# Patient Record
Sex: Male | Born: 1945 | Race: White | Hispanic: No | Marital: Single | Smoking: Former smoker
Health system: Southern US, Community
[De-identification: ages and names within clinical notes are randomized; demographics above are authoritative.]

## PROBLEM LIST (undated history)

## (undated) DIAGNOSIS — M1990 Unspecified osteoarthritis, unspecified site: Secondary | ICD-10-CM

## (undated) DIAGNOSIS — J189 Pneumonia, unspecified organism: Secondary | ICD-10-CM

## (undated) DIAGNOSIS — E119 Type 2 diabetes mellitus without complications: Secondary | ICD-10-CM

## (undated) DIAGNOSIS — M545 Pain in right leg: Secondary | ICD-10-CM

## (undated) DIAGNOSIS — K219 Gastro-esophageal reflux disease without esophagitis: Secondary | ICD-10-CM

## (undated) DIAGNOSIS — Z8719 Personal history of other diseases of the digestive system: Secondary | ICD-10-CM

## (undated) DIAGNOSIS — K862 Cyst of pancreas: Secondary | ICD-10-CM

## (undated) DIAGNOSIS — M79604 Pain in right leg: Secondary | ICD-10-CM

## (undated) HISTORY — PX: INCISIONAL HERNIA REPAIR: SHX193

---

## 2006-06-30 DIAGNOSIS — K862 Cyst of pancreas: Secondary | ICD-10-CM

## 2006-06-30 HISTORY — PX: PANCREAS SURGERY: SHX731

## 2006-06-30 HISTORY — DX: Cyst of pancreas: K86.2

## 2017-04-02 ENCOUNTER — Ambulatory Visit: Payer: Self-pay | Admitting: Orthopedic Surgery

## 2017-04-02 NOTE — Progress Notes (Signed)
Please place orders in EPIC as patient is being scheduled for a pre-op appointment! Thank you! 

## 2017-04-02 NOTE — H&P (Signed)
Seth Decker is an 71 y.o. male.   Chief Complaint: Chief complaint is bilateral lower extremity radicular pain. HPI: The patient is a 71 year old male who presents to the practice today for a transition into care. The patient is transitioning into care from other and a summary of care was reviewed. The patient is here today in referral from Dr.Ramos. The patient reports low back symptoms including pain which began 1 year(s) (DOI: 12/30/15) ago following a specific injury. The injury occurred 1 year(s) ago at work due to twisting (Twisted while lifting load of water). Symptoms are reported to be located in the right low back and Symptoms include pain and numbness. The pain radiates to the right groin, right buttock, right thigh and right lower leg (into the foot). The patient describes the pain as sharp, dull, burning and aching. The patient describes the severity of their symptoms as varies in severity, about a 9-10 today. The patient feels as if the symptoms are worsening. Symptoms are exacerbated by standing (and walking). Symptoms are relieved by non-opioid analgesics (Tramadol and Flexeril). Current treatment includes non-opioid analgesics (Tramadol) and muscle relaxants (Flexeril). Prior to being seen today the patient was previously evaluated by a colleague (Dr.Ramos) week(s) ago. Symptoms present at the patient's previous evaluation included back pain. Past evaluation has included MRI of the lumbar spine. Past treatment has included nonsteroidal anti-inflammatory drugs (Voltaren Gel), non-opioid analgesics (Tramadol), muscle relaxants (Flexeril), epidural injections (10/21/16 - ESI L5-S1 and 12/23/16 - Right L5 SNRB (helps for a day or so)), physical therapy (no help) and Gabapentin. 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 Note for "Back pain": Currently under sedentary work only  HISTORY OF PRESENT ILLNESS: This is a very pleasant gentleman, who presents through kind referral by Dr.  Sheran Luz for refractory lower extremity radicular pain.  On reviewing his extensive medical records to date, he was injured in 12/30/2015. He was lifting up on a hose on a port-a-john and twisted his back and ever since then, he has had radiating pain back into his right leg. He was seen at [facility]. MRI was obtained. Surgery was not recommended. It was recommended he see a physiatrist for further evaluation. He did physical therapy and activity modification until he was seen by Dr. Ethelene Hal where his pain was 10/10. Dr. Ethelene Hal placed him on gabapentin which made him dizzy. He has been placed on Ultram which he indicates does not address the pain sufficiently.  They discussed an epidural steroid injection, which he had at L5-S1. He had no relief at all from that. He did secure an attorney at [employer]. Dr. Ethelene Hal suggested L5 selective nerve root block due to neuroforaminal stenosis. The gentleman, at that point in time, did not have a sustained relief from it; however, for one hour he had numbness and relief of his pain indicating a positive diagnostic block. He had a slight reaction to Chlorascrub antiseptic. He returns for a surgical evaluation.  He reports his pain is predominantly buttock, right lower extremity radicular pain and secondarily leg pain.  He presents here with his sister.  The patient's pain drawing is organic.  No past medical history on file.  No past surgical history on file.  No family history on file. Social History:  has no tobacco, alcohol, and drug history on file.  Allergies: Allergies not on file   (Not in a hospital admission)  No results found for this or any previous visit (from the past  48 hour(s)). No results found.  Review of Systems  Constitutional: Negative.   HENT: Negative.   Eyes: Negative.   Respiratory: Negative.   Cardiovascular: Negative.   Gastrointestinal: Negative.   Genitourinary: Negative.   Musculoskeletal: Positive for back  pain.  Skin: Negative.   Neurological: Positive for sensory change and focal weakness.  Psychiatric/Behavioral: Negative.     There were no vitals taken for this visit. Physical Exam  Constitutional: He is oriented to person, place, and time. He appears well-developed.  HENT:  Head: Normocephalic.  Eyes: Pupils are equal, round, and reactive to light.  Neck: Normal range of motion.  Cardiovascular: Normal rate.   Respiratory: Effort normal.  GI: Soft.  Musculoskeletal:  On exam, moderate distress. Straight leg raises buttock, thigh and calf pain on the right, negative on the left. He has pain with extension. It is somewhat relieved with forward flexion. Limited flexion and extension. He has bilateral EHL weakness, right greater than the left.  Lumbar spine exam reveals no evidence of soft tissue swelling, deformity or skin ecchymosis. On palpation there is no tenderness of the lumbar spine. No flank pain with percussion. The abdomen is soft and nontender. Nontender over the trochanters. No cellulitis or lymphadenopathy.  Good range of motion of the lumbar spine without associated pain. Motor is 5/5 including tibialis anterior, plantar flexion, quadriceps and hamstrings. Patient is normoreflexic. There is no Babinski or clonus. Sensory exam is intact to light touch. Patient has good distal pulses. No DVT. Normal range of motion without instability of the hips, knees and ankles.  Neurological: He is alert and oriented to person, place, and time.    Three view radiographs, AP and lateral, flexion and extension demonstrates thoracolumbar scoliosis, dextrorotatory. He has a narrow interlaminar window at 4-5 and at 5-1. On the lateral, he has disc degeneration at 5-1, 4-5 and 3-4, mild at 2-1. Mild calcification of the aorta is noted.  MRI obtained 02/05/2016, he reports no changes in his symptoms since that MRI. That indicates end-stage disc degeneration at 5-1. He has multilevel disc  degeneration and disc protrusions at 3-4, 4-5 and at 5-1. There is a small, paracentral disc herniation at 4-5 to the right. There is neuroforaminal stenosis of L5 to the right as well.  There is associated lateral recess stenosis at L5-S1.  Assessment/Plan 1. 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 2. 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 3. Tobacco dependence.  Recommendations and discussion: Extensive discussion with Mr. Egger concerning his current pathology, relevant anatomy and treatment options. Certainly, I feel that there are multiple options. I feel that he has had appropriate treatment to date with diagnostic studies, conservative treatment including therapy, injection therapy and pharmacologic management. Two options at this point; one to currently live with the symptoms and continue with pain management, injection therapy, etc. and the current restrictions that he has been forwarded.  From a surgical standpoint, on reviewing his studies to date he has an L5-S1 radiculopathy with EHL weakness and a diagnostic block at 5, given his pathology. He may have moderate-to-severe foraminal stenosis of L5 on the right and the lateral recess at L5-S1. He does have facet arthropathy and this indicating possibly abutting the L5 nerve root at 4-5 and at L5-S1. Given the profound radiculopathy, an option will be to consider a microlumbar decompression and foraminotomy and partial facetectomy at L5-S1  and a hemilaminectomy on the right in an effort to decompress the L5 and the S1 nerve roots. I did indicate that, that procedure would not address the back pain but with the complaint of radicular pain and the procedure that would address back pain, would require a multilevel fusion. I do not feel that a magnitude of that intervention would not  be appropriate in this instance.  Again though, he did have a diagnostic block at L5 that confirmed the pain generator; however, given that it has been over a year's duration, certainly a decompression even at this point may be unproductive. However, he is at the end of conservative treatment and feels he cannot live with his current symptomatology. It is reasonable to perform a decompression in the hopes to alleviate at least in some part, his radiculopathy. He, however, would have to commit to tobacco cessation in my opinion prior to that. I indicated deleterious side effects upon healing, epidural fibrosis, etc.  Again particularly down the L5-S1 if you can decompress the lateral recess and perform a foraminotomy of L5 and remove the superior articulating process of S1 that tends to project and impinge upon the 5 root when individuals are in the upright extended position, it would be an option. Again, I agreed with his initial conservative treatment as this would be considered a last resort. He has no symptoms on the left. We discussed tobacco cessation. In extensive detail, he has tried many modalities. He does get anxious when he is attempting to tobacco cessation. We; therefore, discussed Ativan preoperatively and perioperatively as an anxiolytic agent.  Recommend overnight immediate ambulation. We did discuss the risks and benefits.  I had an extensive discussion of the risks and benefits of the lumbar decompression with the patient including bleeding, infection, damage to neurovascular structures, epidural fibrosis, CSF leak requiring repair. We also discussed increase in pain, adjacent segment disease, recurrent disc herniation, need for future surgery including repeat decompression and/or fusion. We also discussed risks of postoperative hematoma, paralysis, anesthetic complications including DVT, PE, death, cardiopulmonary dysfunction. In addition, the perioperative and postoperative courses were  discussed in detail including the rehabilitative time and return to functional activity and work. I provided the patient with an illustrated handout and utilized the appropriate surgical models.  Particularly, I would not address back pain. They may have ongoing symptoms secondary to battered root syndrome etc. It does seem; however, significantly disabled by the predominant radiculopathy and therefore, given its duration and failing all other treatment and the absence of tolerance to gabapentin, other pharmacologic management I feel that it is appropriate to proceed with a limited decompression and foraminotomy on the right.  We spent a considerable time discussing all of these issues and over 40 minutes were dedicated to this discussion and his pathology, relevant anatomy and treatment options and treatment today. He is a diabetic. We discussed strict control of his glucose preoperatively and the time to maximum medical improvement will be three months postop.  He is fairly thin. He reports to be otherwise healthy. No chest pain, shortness of breath, strokes or heart attacks.  I appreciate the opportunity to evaluate Mr. Kaigler. Discussed this at length and separately with his case manager. Again, I feel that if his pain was controlled pharmacologically, I would recommend nonoperative treatment. Again though, I do feel it is reasonable to proceed with a decompression.  microlumbar decompression L5-S1 right, possible L4-5  Dorothy Spark., PA-C for Dr. Shelle Iron 04/02/2017, 3:21 PM

## 2017-04-08 ENCOUNTER — Other Ambulatory Visit (HOSPITAL_COMMUNITY): Payer: Self-pay | Admitting: Emergency Medicine

## 2017-04-08 NOTE — Patient Instructions (Addendum)
Seth Decker  04/08/2017   Your procedure is scheduled on: 04-15-17  Report to [facility] Main  Entrance Take [place]  elevators to 3rd floor to  Short Stay Center at 930AM.    Call this number if you have problems the morning of surgery [phone]    Remember: ONLY 1 PERSON MAY GO WITH YOU TO SHORT STAY TO GET  READY MORNING OF YOUR SURGERY.  Do not eat food or drink liquids :After Midnight.     Take these medicines the morning of surgery with A SIP OF WATER: tylenol as needed, eye drops, tramadol as needed                                You may not have any metal on your body including hair pins and              piercings  Do not wear jewelry, make-up, lotions, powders or perfumes, deodorant                          Men may shave face and neck.   Do not bring valuables to the hospital. [facility] IS NOT             RESPONSIBLE   FOR VALUABLES.  Contacts, dentures or bridgework may not be worn into surgery.  Leave suitcase in the car. After surgery it may be brought to your room.                Please read over the following fact sheets you were given: _____________________________________________________________________  How to Manage Your Diabetes Before and After Surgery  Why is it important to control my blood sugar before and after surgery? . Improving blood sugar levels before and after surgery helps healing and can limit problems. . A way of improving blood sugar control is eating a healthy diet by: o  Eating less sugar and carbohydrates o  Increasing activity/exercise o  Talking with your doctor about reaching your blood sugar goals . High blood sugars (greater than 180 mg/dL) can raise your risk of infections and slow your recovery, so you will need to focus on controlling your diabetes during the weeks before surgery. . Make sure that the doctor who takes care of your diabetes knows about your planned surgery including the date and  location.  How do I manage my blood sugar before surgery?   . If you are admitted to the hospital after surgery: o Your blood sugar will be checked by the staff and you will probably be given insulin after surgery (instead of oral diabetes medicines) to make sure you have good blood sugar levels. o The goal for blood sugar control after surgery is 80-180 mg/dL.   WHAT DO I DO ABOUT MY DIABETES MEDICATION?    . THE DAY BEFORE SURGERY, take  METFORMIN as normal       . THE MORNING OF SURGERY, DO NOT TAKE ANY DIABETIC MEDICATIONS !!  Patient Signature:  Date:   Nurse Signature:  Date:   Reviewed and Endorsed by [facility] Patient Education Committee, August 2015   [facility] - Preparing for Surgery Before surgery, you can play an important role.  Because skin is not sterile, your skin needs to be as free of germs as possible.  You can reduce the number of germs on your skin by washing with CHG (chlorahexidine gluconate) soap before surgery.  CHG is an antiseptic cleaner which kills germs and bonds with the skin to continue killing germs even after washing. Please DO NOT use if you have an allergy to CHG or antibacterial soaps.  If your skin becomes reddened/irritated stop using the CHG and inform your nurse when you arrive at Short Stay. Do not shave (including legs and underarms) for at least 48 hours prior to the first CHG shower.  You may shave your face/neck. Please follow these instructions carefully:  1.  Shower with CHG Soap the night before surgery and the  morning of Surgery.  2.  If you choose to wash your hair, wash your hair first as usual with your  normal  shampoo.  3.  After you shampoo, rinse your hair and body thoroughly to remove the  shampoo.                           4.  Use CHG as you would any other liquid soap.  You can apply chg directly  to the skin and wash                       Gently with a scrungie or clean washcloth.  5.  Apply the CHG Soap to your body  ONLY FROM THE NECK DOWN.   Do not use on face/ open                           Wound or open sores. Avoid contact with eyes, ears mouth and genitals (private parts).                       Wash face,  Genitals (private parts) with your normal soap.             6.  Wash thoroughly, paying special attention to the area where your surgery  will be performed.  7.  Thoroughly rinse your body with warm water from the neck down.  8.  DO NOT shower/wash with your normal soap after using and rinsing off  the CHG Soap.                9.  Pat yourself dry with a clean towel.            10.  Wear clean pajamas.            11.  Place clean sheets on your bed the night of your first shower and do not  sleep with pets. Day of Surgery : Do not apply any lotions/deodorants the morning of surgery.  Please wear clean clothes to the hospital/surgery center.  FAILURE TO FOLLOW THESE INSTRUCTIONS MAY RESULT IN THE CANCELLATION OF YOUR SURGERY PATIENT SIGNATURE_________________________________  NURSE SIGNATURE__________________________________  ________________________________________________________________________   Seth Decker  An incentive spirometer is a tool that can help keep your lungs clear and active. This tool measures how well you are filling your lungs with each breath. Taking long deep breaths may help reverse or decrease the chance of developing breathing (pulmonary) problems (especially infection) following:  A long period of time when you are unable to move or be active. BEFORE THE PROCEDURE   If the spirometer includes an indicator to show your best effort, your nurse or respiratory therapist will set  it to a desired goal.  If possible, sit up straight or lean slightly forward. Try not to slouch.  Hold the incentive spirometer in an upright position. INSTRUCTIONS FOR USE  1. Sit on the edge of your bed if possible, or sit up as far as you can in bed or on a chair. 2. Hold the  incentive spirometer in an upright position. 3. Breathe out normally. 4. Place the mouthpiece in your mouth and seal your lips tightly around it. 5. Breathe in slowly and as deeply as possible, raising the piston or the ball toward the top of the column. 6. Hold your breath for 3-5 seconds or for as long as possible. Allow the piston or ball to fall to the bottom of the column. 7. Remove the mouthpiece from your mouth and breathe out normally. 8. Rest for a few seconds and repeat Steps 1 through 7 at least 10 times every 1-2 hours when you are awake. Take your time and take a few normal breaths between deep breaths. 9. The spirometer may include an indicator to show your best effort. Use the indicator as a goal to work toward during each repetition. 10. After each set of 10 deep breaths, practice coughing to be sure your lungs are clear. If you have an incision (the cut made at the time of surgery), support your incision when coughing by placing a pillow or rolled up towels firmly against it. Once you are able to get out of bed, walk around indoors and cough well. You may stop using the incentive spirometer when instructed by your caregiver.  RISKS AND COMPLICATIONS  Take your time so you do not get dizzy or light-headed.  If you are in pain, you may need to take or ask for pain medication before doing incentive spirometry. It is harder to take a deep breath if you are having pain. AFTER USE  Rest and breathe slowly and easily.  It can be helpful to keep track of a log of your progress. Your caregiver can provide you with a simple table to help with this. If you are using the spirometer at home, follow these instructions: SEEK MEDICAL CARE IF:   You are having difficultly using the spirometer.  You have trouble using the spirometer as often as instructed.  Your pain medication is not giving enough relief while using the spirometer.  You develop fever of 100.5 F (38.1 C) or  higher. SEEK IMMEDIATE MEDICAL CARE IF:   You cough up bloody sputum that had not been present before.  You develop fever of 102 F (38.9 C) or greater.  You develop worsening pain at or near the incision site. MAKE SURE YOU:   Understand these instructions.  Will watch your condition.  Will get help right away if you are not doing well or get worse. Document Released: 10/27/2006 Document Revised: 09/08/2011 Document Reviewed: 12/28/2006 [facility] Patient Information 2014 [place], [place].   ________________________________________________________________________

## 2017-04-09 ENCOUNTER — Encounter (HOSPITAL_COMMUNITY): Payer: Self-pay

## 2017-04-09 ENCOUNTER — Encounter (HOSPITAL_COMMUNITY)
Admission: RE | Admit: 2017-04-09 | Discharge: 2017-04-09 | Disposition: A | Discharge status: Home / Self Care | Payer: Worker's Compensation | Source: Ambulatory Visit | Attending: Specialist | Admitting: Specialist

## 2017-04-09 ENCOUNTER — Encounter (INDEPENDENT_AMBULATORY_CARE_PROVIDER_SITE_OTHER): Payer: Self-pay

## 2017-04-09 ENCOUNTER — Ambulatory Visit (HOSPITAL_COMMUNITY)
Admission: RE | Admit: 2017-04-09 | Discharge: 2017-04-09 | Disposition: A | Discharge status: Home / Self Care | Payer: Worker's Compensation | Source: Ambulatory Visit | Attending: Orthopedic Surgery | Admitting: Orthopedic Surgery

## 2017-04-09 DIAGNOSIS — M5126 Other intervertebral disc displacement, lumbar region: Secondary | ICD-10-CM

## 2017-04-09 DIAGNOSIS — I700 Atherosclerosis of aorta: Secondary | ICD-10-CM | POA: Diagnosis not present

## 2017-04-09 DIAGNOSIS — M5137 Other intervertebral disc degeneration, lumbosacral region: Secondary | ICD-10-CM | POA: Diagnosis not present

## 2017-04-09 HISTORY — DX: Low back pain: M54.5

## 2017-04-09 HISTORY — DX: Pain in right leg: M79.604

## 2017-04-09 HISTORY — DX: Personal history of other diseases of the digestive system: Z87.19

## 2017-04-09 HISTORY — DX: Pain in right leg: M54.50

## 2017-04-09 HISTORY — DX: Cyst of pancreas: K86.2

## 2017-04-09 HISTORY — DX: Pneumonia, unspecified organism: J18.9

## 2017-04-09 HISTORY — DX: Gastro-esophageal reflux disease without esophagitis: K21.9

## 2017-04-09 HISTORY — DX: Type 2 diabetes mellitus without complications: E11.9

## 2017-04-09 HISTORY — DX: Unspecified osteoarthritis, unspecified site: M19.90

## 2017-04-09 LAB — BASIC METABOLIC PANEL
Anion gap: 8 (ref 5–15)
BUN: 10 mg/dL (ref 6–20)
CHLORIDE: 102 mmol/L (ref 101–111)
CO2: 27 mmol/L (ref 22–32)
Calcium: 8.7 mg/dL — ABNORMAL LOW (ref 8.9–10.3)
Creatinine, Ser: 0.91 mg/dL (ref 0.61–1.24)
GFR calc non Af Amer: >60 mL/min (ref >60)
Glucose, Bld: 112 mg/dL — ABNORMAL HIGH (ref 65–99)
POTASSIUM: 3.9 mmol/L (ref 3.5–5.1)
SODIUM: 137 mmol/L (ref 135–145)

## 2017-04-09 LAB — CBC
HEMATOCRIT: 37.9 % — AB (ref 39.0–52.0)
HEMOGLOBIN: 12.5 g/dL — AB (ref 13.0–17.0)
MCH: 29.9 pg (ref 26.0–34.0)
MCHC: 33 g/dL (ref 30.0–36.0)
MCV: 90.7 fL (ref 78.0–100.0)
Platelets: 139 10*3/uL — ABNORMAL LOW (ref 150–400)
RBC: 4.18 MIL/uL — AB (ref 4.22–5.81)
RDW: 13.4 % (ref 11.5–15.5)
WBC: 8.3 10*3/uL (ref 4.0–10.5)

## 2017-04-09 LAB — GLUCOSE, CAPILLARY: GLUCOSE-CAPILLARY: 146 mg/dL — AB (ref 65–99)

## 2017-04-09 LAB — SURGICAL PCR SCREEN
MRSA, PCR: NEGATIVE
Staphylococcus aureus: POSITIVE — AB

## 2017-04-09 NOTE — Progress Notes (Addendum)
BACK XRAY ROUTED VIA EPIC TO DR JEFFREY BEANE   LVMM FOR Seth Decker AT [facility] TO NOTIFY DR [facility] NEEDS TO REVIEW RADIOLOGY RECOMMENDATION BEFORE SURGERY!. PHYSICAL COPY OF BACK XRAY RESULT FAXED AS WELL.

## 2017-04-10 ENCOUNTER — Ambulatory Visit: Payer: Self-pay | Admitting: Orthopedic Surgery

## 2017-04-14 NOTE — Progress Notes (Signed)
EKG dated 12/2016 received from [facility] [address] [place], [place] [address]

## 2017-04-14 NOTE — Progress Notes (Signed)
RN requested recent LOV, LABS, and EKG per patient request from PCP Dr Dorna Leitz in [place] initially on 04-09-17. RN re-requested same info from [place] office on 04-13-17 since none had been received. Spoke with Deanna Artis at the office  today to again (third time) request info , per [facility] the office is currently looking for patient EKG as it had been sent to be scanned in. Informed Deanna Artis that patient surgery is tomorrow. Per Deanna Artis, all requested info should be faxed by end of work day today.

## 2017-04-15 ENCOUNTER — Ambulatory Visit (HOSPITAL_COMMUNITY)
Admission: RE | Admit: 2017-04-15 | Discharge: 2017-04-16 | Disposition: A | Discharge status: Home / Self Care | Payer: Worker's Compensation | Source: Ambulatory Visit | Attending: Specialist | Admitting: Specialist

## 2017-04-15 ENCOUNTER — Ambulatory Visit (HOSPITAL_COMMUNITY): Payer: Worker's Compensation | Admitting: Anesthesiology

## 2017-04-15 ENCOUNTER — Encounter (HOSPITAL_COMMUNITY)
Admission: RE | Disposition: A | Discharge status: Home / Self Care | Payer: Self-pay | Source: Ambulatory Visit | Attending: Specialist

## 2017-04-15 ENCOUNTER — Ambulatory Visit (HOSPITAL_COMMUNITY): Payer: Worker's Compensation

## 2017-04-15 ENCOUNTER — Encounter (HOSPITAL_COMMUNITY): Payer: Self-pay

## 2017-04-15 DIAGNOSIS — Z7984 Long term (current) use of oral hypoglycemic drugs: Secondary | ICD-10-CM | POA: Insufficient documentation

## 2017-04-15 DIAGNOSIS — M1990 Unspecified osteoarthritis, unspecified site: Secondary | ICD-10-CM | POA: Diagnosis not present

## 2017-04-15 DIAGNOSIS — K219 Gastro-esophageal reflux disease without esophagitis: Secondary | ICD-10-CM | POA: Diagnosis not present

## 2017-04-15 DIAGNOSIS — Z79899 Other long term (current) drug therapy: Secondary | ICD-10-CM | POA: Insufficient documentation

## 2017-04-15 DIAGNOSIS — Z87891 Personal history of nicotine dependence: Secondary | ICD-10-CM | POA: Insufficient documentation

## 2017-04-15 DIAGNOSIS — M48061 Spinal stenosis, lumbar region without neurogenic claudication: Secondary | ICD-10-CM | POA: Diagnosis not present

## 2017-04-15 DIAGNOSIS — M4727 Other spondylosis with radiculopathy, lumbosacral region: Secondary | ICD-10-CM | POA: Diagnosis not present

## 2017-04-15 DIAGNOSIS — Z419 Encounter for procedure for purposes other than remedying health state, unspecified: Secondary | ICD-10-CM

## 2017-04-15 DIAGNOSIS — M5116 Intervertebral disc disorders with radiculopathy, lumbar region: Secondary | ICD-10-CM | POA: Diagnosis not present

## 2017-04-15 DIAGNOSIS — M4807 Spinal stenosis, lumbosacral region: Secondary | ICD-10-CM | POA: Diagnosis not present

## 2017-04-15 DIAGNOSIS — E119 Type 2 diabetes mellitus without complications: Secondary | ICD-10-CM | POA: Insufficient documentation

## 2017-04-15 HISTORY — PX: LUMBAR LAMINECTOMY/DECOMPRESSION MICRODISCECTOMY: SHX5026

## 2017-04-15 LAB — GLUCOSE, CAPILLARY
GLUCOSE-CAPILLARY: 103 mg/dL — AB (ref 65–99)
Glucose-Capillary: 120 mg/dL — ABNORMAL HIGH (ref 65–99)
Glucose-Capillary: 147 mg/dL — ABNORMAL HIGH (ref 65–99)
Glucose-Capillary: 185 mg/dL — ABNORMAL HIGH (ref 65–99)

## 2017-04-15 SURGERY — LUMBAR LAMINECTOMY/DECOMPRESSION MICRODISCECTOMY 2 LEVELS
Anesthesia: General | Site: Back | Laterality: Right

## 2017-04-15 MED ORDER — ACETAMINOPHEN 325 MG PO TABS: 650.0000 mg | ORAL_TABLET | ORAL | Status: DC | PRN

## 2017-04-15 MED ORDER — HYDROCODONE-ACETAMINOPHEN 5-325 MG PO TABS
1.0000 | ORAL_TABLET | ORAL | Status: DC | PRN
  Administered 2017-04-15 (×2): 1 via ORAL
  Filled 2017-04-15 (×2): qty 1

## 2017-04-15 MED ORDER — PHENYLEPHRINE 40 MCG/ML (10ML) SYRINGE FOR IV PUSH (FOR BLOOD PRESSURE SUPPORT)
PREFILLED_SYRINGE | INTRAVENOUS | Status: DC | PRN
  Administered 2017-04-15 (×5): 80 ug via INTRAVENOUS

## 2017-04-15 MED ORDER — BUPIVACAINE-EPINEPHRINE (PF) 0.5% -1:200000 IJ SOLN
INTRAMUSCULAR | Status: DC | PRN
  Administered 2017-04-15: 10 mL

## 2017-04-15 MED ORDER — THROMBIN 5000 UNITS EX SOLR
CUTANEOUS | Status: AC
  Filled 2017-04-15: qty 10000

## 2017-04-15 MED ORDER — FENTANYL CITRATE (PF) 100 MCG/2ML IJ SOLN
INTRAMUSCULAR | Status: AC
  Administered 2017-04-15: 50 ug via INTRAVENOUS
  Filled 2017-04-15: qty 2

## 2017-04-15 MED ORDER — FENTANYL CITRATE (PF) 100 MCG/2ML IJ SOLN
INTRAMUSCULAR | Status: DC | PRN
  Administered 2017-04-15: 100 ug via INTRAVENOUS
  Administered 2017-04-15: 10 ug via INTRAVENOUS

## 2017-04-15 MED ORDER — ONDANSETRON HCL 4 MG PO TABS: 4.0000 mg | ORAL_TABLET | Freq: Four times a day (QID) | ORAL | Status: DC | PRN

## 2017-04-15 MED ORDER — DEXAMETHASONE SODIUM PHOSPHATE 10 MG/ML IJ SOLN
INTRAMUSCULAR | Status: DC | PRN
  Administered 2017-04-15: 10 mg via INTRAVENOUS

## 2017-04-15 MED ORDER — POLYETHYLENE GLYCOL 3350 17 G PO PACK: 17.0000 g | PACK | Freq: Every day | ORAL | 0 refills | Status: AC

## 2017-04-15 MED ORDER — CEFAZOLIN SODIUM-DEXTROSE 2-4 GM/100ML-% IV SOLN
2.0000 g | INTRAVENOUS | Status: AC
  Administered 2017-04-15: 2 g via INTRAVENOUS
  Filled 2017-04-15: qty 100

## 2017-04-15 MED ORDER — DOCUSATE SODIUM 100 MG PO CAPS: 100.0000 mg | ORAL_CAPSULE | Freq: Two times a day (BID) | ORAL | 1 refills | Status: AC | PRN

## 2017-04-15 MED ORDER — FENTANYL CITRATE (PF) 100 MCG/2ML IJ SOLN
INTRAMUSCULAR | Status: AC
  Filled 2017-04-15: qty 2

## 2017-04-15 MED ORDER — THROMBIN 5000 UNITS EX SOLR
OROMUCOSAL | Status: DC | PRN
  Administered 2017-04-15: 5 mL via TOPICAL

## 2017-04-15 MED ORDER — PROPOFOL 10 MG/ML IV BOLUS
INTRAVENOUS | Status: DC | PRN
  Administered 2017-04-15: 200 mg via INTRAVENOUS

## 2017-04-15 MED ORDER — SODIUM CHLORIDE 0.9 % IV SOLN
INTRAVENOUS | Status: DC | PRN
  Administered 2017-04-15: 500 mL

## 2017-04-15 MED ORDER — DEXAMETHASONE SODIUM PHOSPHATE 10 MG/ML IJ SOLN
INTRAMUSCULAR | Status: AC
  Filled 2017-04-15: qty 1

## 2017-04-15 MED ORDER — ROCURONIUM BROMIDE 50 MG/5ML IV SOSY
PREFILLED_SYRINGE | INTRAVENOUS | Status: AC
  Filled 2017-04-15: qty 5

## 2017-04-15 MED ORDER — MIDAZOLAM HCL 2 MG/2ML IJ SOLN
INTRAMUSCULAR | Status: AC
  Filled 2017-04-15: qty 2

## 2017-04-15 MED ORDER — PHENYLEPHRINE HCL 10 MG/ML IJ SOLN
INTRAVENOUS | Status: DC | PRN
  Administered 2017-04-15: 25 ug/min via INTRAVENOUS

## 2017-04-15 MED ORDER — MAGNESIUM CITRATE PO SOLN: 1.0000 | Freq: Once | ORAL | Status: DC | PRN

## 2017-04-15 MED ORDER — FENTANYL CITRATE (PF) 100 MCG/2ML IJ SOLN
25.0000 ug | INTRAMUSCULAR | Status: DC | PRN
  Administered 2017-04-15 (×3): 50 ug via INTRAVENOUS

## 2017-04-15 MED ORDER — DOCUSATE SODIUM 100 MG PO CAPS
100.0000 mg | ORAL_CAPSULE | Freq: Two times a day (BID) | ORAL | Status: DC
  Administered 2017-04-15 – 2017-04-16 (×2): 100 mg via ORAL
  Filled 2017-04-15 (×2): qty 1

## 2017-04-15 MED ORDER — ACETAMINOPHEN 10 MG/ML IV SOLN
1000.0000 mg | INTRAVENOUS | Status: AC
  Administered 2017-04-15: 1000 mg via INTRAVENOUS
  Filled 2017-04-15: qty 100

## 2017-04-15 MED ORDER — SUCCINYLCHOLINE CHLORIDE 200 MG/10ML IV SOSY
PREFILLED_SYRINGE | INTRAVENOUS | Status: AC
  Filled 2017-04-15: qty 10

## 2017-04-15 MED ORDER — LIDOCAINE 2% (20 MG/ML) 5 ML SYRINGE
INTRAMUSCULAR | Status: DC | PRN
  Administered 2017-04-15: 100 mg via INTRAVENOUS

## 2017-04-15 MED ORDER — PHENYLEPHRINE HCL 10 MG/ML IJ SOLN
INTRAMUSCULAR | Status: AC
  Filled 2017-04-15: qty 1

## 2017-04-15 MED ORDER — CEFAZOLIN SODIUM-DEXTROSE 2-4 GM/100ML-% IV SOLN
2.0000 g | Freq: Three times a day (TID) | INTRAVENOUS | Status: AC
  Administered 2017-04-15 – 2017-04-16 (×2): 2 g via INTRAVENOUS
  Filled 2017-04-15 (×2): qty 100

## 2017-04-15 MED ORDER — PHENYLEPHRINE 40 MCG/ML (10ML) SYRINGE FOR IV PUSH (FOR BLOOD PRESSURE SUPPORT)
PREFILLED_SYRINGE | INTRAVENOUS | Status: AC
  Filled 2017-04-15: qty 10

## 2017-04-15 MED ORDER — INSULIN ASPART 100 UNIT/ML ~~LOC~~ SOLN
0.0000 [IU] | Freq: Three times a day (TID) | SUBCUTANEOUS | Status: DC
  Administered 2017-04-15: 18:00:00 2 [IU] via SUBCUTANEOUS
  Administered 2017-04-16: 5 [IU] via SUBCUTANEOUS

## 2017-04-15 MED ORDER — ONDANSETRON HCL 4 MG/2ML IJ SOLN
INTRAMUSCULAR | Status: AC
  Filled 2017-04-15: qty 2

## 2017-04-15 MED ORDER — METHOCARBAMOL 1000 MG/10ML IJ SOLN
500.0000 mg | Freq: Four times a day (QID) | INTRAVENOUS | Status: DC | PRN
  Administered 2017-04-15: 500 mg via INTRAVENOUS
  Filled 2017-04-15: qty 550

## 2017-04-15 MED ORDER — SUGAMMADEX SODIUM 200 MG/2ML IV SOLN
INTRAVENOUS | Status: AC
  Filled 2017-04-15: qty 2

## 2017-04-15 MED ORDER — ONDANSETRON HCL 4 MG/2ML IJ SOLN
4.0000 mg | Freq: Four times a day (QID) | INTRAMUSCULAR | Status: DC | PRN
  Administered 2017-04-15: 4 mg via INTRAVENOUS
  Filled 2017-04-15: qty 2

## 2017-04-15 MED ORDER — TRAMADOL HCL 50 MG PO TABS: 50.0000 mg | ORAL_TABLET | Freq: Four times a day (QID) | ORAL | Status: DC | PRN

## 2017-04-15 MED ORDER — PHENOL 1.4 % MT LIQD
1.0000 | OROMUCOSAL | Status: DC | PRN
  Filled 2017-04-15: qty 177

## 2017-04-15 MED ORDER — SUGAMMADEX SODIUM 200 MG/2ML IV SOLN
INTRAVENOUS | Status: DC | PRN
  Administered 2017-04-15: 200 mg via INTRAVENOUS

## 2017-04-15 MED ORDER — ACETAMINOPHEN 650 MG RE SUPP
650.0000 mg | RECTAL | Status: DC | PRN
Start: 2017-04-15 — End: 2017-04-16

## 2017-04-15 MED ORDER — SUCCINYLCHOLINE CHLORIDE 200 MG/10ML IV SOSY
PREFILLED_SYRINGE | INTRAVENOUS | Status: DC | PRN
  Administered 2017-04-15: 120 mg via INTRAVENOUS

## 2017-04-15 MED ORDER — ROCURONIUM BROMIDE 10 MG/ML (PF) SYRINGE
PREFILLED_SYRINGE | INTRAVENOUS | Status: DC | PRN
  Administered 2017-04-15: 10 mg via INTRAVENOUS
  Administered 2017-04-15: 50 mg via INTRAVENOUS

## 2017-04-15 MED ORDER — BUPIVACAINE-EPINEPHRINE (PF) 0.5% -1:200000 IJ SOLN
INTRAMUSCULAR | Status: AC
  Filled 2017-04-15: qty 30

## 2017-04-15 MED ORDER — MIDAZOLAM HCL 5 MG/5ML IJ SOLN
INTRAMUSCULAR | Status: DC | PRN
  Administered 2017-04-15: 2 mg via INTRAVENOUS

## 2017-04-15 MED ORDER — POTASSIUM CHLORIDE IN NACL 20-0.9 MEQ/L-% IV SOLN
INTRAVENOUS | Status: DC
  Administered 2017-04-15: 15:00:00 via INTRAVENOUS
  Filled 2017-04-15 (×2): qty 1000

## 2017-04-15 MED ORDER — ALUM & MAG HYDROXIDE-SIMETH 200-200-20 MG/5ML PO SUSP
30.0000 mL | Freq: Four times a day (QID) | ORAL | Status: DC | PRN
  Administered 2017-04-16: 30 mL via ORAL
  Filled 2017-04-15: qty 30

## 2017-04-15 MED ORDER — LACTATED RINGERS IV SOLN
INTRAVENOUS | Status: DC
  Administered 2017-04-15 (×3): via INTRAVENOUS

## 2017-04-15 MED ORDER — BISACODYL 5 MG PO TBEC: 5.0000 mg | DELAYED_RELEASE_TABLET | Freq: Every day | ORAL | Status: DC | PRN

## 2017-04-15 MED ORDER — POLYETHYLENE GLYCOL 3350 17 G PO PACK: 17.0000 g | PACK | Freq: Every day | ORAL | Status: DC | PRN

## 2017-04-15 MED ORDER — SODIUM CHLORIDE 0.9 % IV SOLN
INTRAVENOUS | Status: AC
  Filled 2017-04-15: qty 500000

## 2017-04-15 MED ORDER — ACETAMINOPHEN 10 MG/ML IV SOLN
1000.0000 mg | Freq: Four times a day (QID) | INTRAVENOUS | Status: DC
  Administered 2017-04-15 – 2017-04-16 (×3): 1000 mg via INTRAVENOUS
  Filled 2017-04-15 (×4): qty 100

## 2017-04-15 MED ORDER — LIDOCAINE 2% (20 MG/ML) 5 ML SYRINGE
INTRAMUSCULAR | Status: AC
  Filled 2017-04-15: qty 5

## 2017-04-15 MED ORDER — TETRAHYDROZOLINE HCL 0.05 % OP SOLN
1.0000 [drp] | Freq: Every day | OPHTHALMIC | Status: DC | PRN
  Filled 2017-04-15: qty 15

## 2017-04-15 MED ORDER — METHOCARBAMOL 500 MG PO TABS
500.0000 mg | ORAL_TABLET | Freq: Four times a day (QID) | ORAL | Status: DC | PRN
  Administered 2017-04-15 – 2017-04-16 (×2): 500 mg via ORAL
  Filled 2017-04-15 (×2): qty 1

## 2017-04-15 MED ORDER — MENTHOL 3 MG MT LOZG: 1.0000 | LOZENGE | OROMUCOSAL | Status: DC | PRN

## 2017-04-15 MED ORDER — LORAZEPAM 0.5 MG PO TABS
0.5000 mg | ORAL_TABLET | Freq: Three times a day (TID) | ORAL | Status: DC | PRN
  Administered 2017-04-15: 0.5 mg via ORAL
  Filled 2017-04-15: qty 1

## 2017-04-15 MED ORDER — HYDROCODONE-ACETAMINOPHEN 5-325 MG PO TABS: 1.0000 | ORAL_TABLET | ORAL | 0 refills | Status: AC | PRN

## 2017-04-15 MED ORDER — ONDANSETRON HCL 4 MG/2ML IJ SOLN
INTRAMUSCULAR | Status: DC | PRN
  Administered 2017-04-15: 4 mg via INTRAVENOUS

## 2017-04-15 MED ORDER — PROMETHAZINE HCL 25 MG/ML IJ SOLN: 6.2500 mg | INTRAMUSCULAR | Status: DC | PRN

## 2017-04-15 MED ORDER — CLINDAMYCIN PHOSPHATE 900 MG/50ML IV SOLN
900.0000 mg | INTRAVENOUS | Status: AC
  Administered 2017-04-15: 900 mg via INTRAVENOUS
  Filled 2017-04-15: qty 50

## 2017-04-15 MED ORDER — HYDROMORPHONE HCL-NACL 0.5-0.9 MG/ML-% IV SOSY
0.5000 mg | PREFILLED_SYRINGE | INTRAVENOUS | Status: DC | PRN
  Administered 2017-04-15 (×2): 0.5 mg via INTRAVENOUS
  Filled 2017-04-15 (×2): qty 1

## 2017-04-15 MED ORDER — PROPOFOL 10 MG/ML IV BOLUS
INTRAVENOUS | Status: AC
  Filled 2017-04-15: qty 20

## 2017-04-15 SURGICAL SUPPLY — 46 items
BAG ZIPLOCK 12X15 (MISCELLANEOUS) ×3 IMPLANT
CLEANER TIP ELECTROSURG 2X2 (MISCELLANEOUS) ×3 IMPLANT
CLOSURE WOUND 1/2 X4 (GAUZE/BANDAGES/DRESSINGS) ×1
CLOTH 2% CHLOROHEXIDINE 3PK (PERSONAL CARE ITEMS) ×3 IMPLANT
COVER SURGICAL LIGHT HANDLE (MISCELLANEOUS) ×3 IMPLANT
DRAPE MICROSCOPE LEICA (MISCELLANEOUS) ×3 IMPLANT
DRAPE SHEET LG 3/4 BI-LAMINATE (DRAPES) ×3 IMPLANT
DRAPE SURG 17X11 SM STRL (DRAPES) ×3 IMPLANT
DRAPE UTILITY XL STRL (DRAPES) ×3 IMPLANT
DRSG AQUACEL AG ADV 3.5X 6 (GAUZE/BANDAGES/DRESSINGS) ×3 IMPLANT
DURAPREP 26ML APPLICATOR (WOUND CARE) ×3 IMPLANT
ELECT REM PT RETURN 15FT ADLT (MISCELLANEOUS) ×3 IMPLANT
GLOVE BIOGEL PI IND STRL 7.0 (GLOVE) ×3 IMPLANT
GLOVE BIOGEL PI IND STRL 7.5 (GLOVE) ×2 IMPLANT
GLOVE BIOGEL PI INDICATOR 7.0 (GLOVE) ×6
GLOVE BIOGEL PI INDICATOR 7.5 (GLOVE) ×4
GLOVE SURG SS PI 7.0 STRL IVOR (GLOVE) ×3 IMPLANT
GLOVE SURG SS PI 7.5 STRL IVOR (GLOVE) ×3 IMPLANT
GLOVE SURG SS PI 8.0 STRL IVOR (GLOVE) ×6 IMPLANT
GOWN STRL REUS W/ TWL XL LVL3 (GOWN DISPOSABLE) ×1 IMPLANT
GOWN STRL REUS W/TWL XL LVL3 (GOWN DISPOSABLE) ×11 IMPLANT
HEMOSTAT SPONGE AVITENE ULTRA (HEMOSTASIS) IMPLANT
IV CATH 14GX2 1/4 (CATHETERS) ×3 IMPLANT
KIT BASIN OR (CUSTOM PROCEDURE TRAY) ×3 IMPLANT
KIT POSITIONING SURG ANDREWS (MISCELLANEOUS) ×3 IMPLANT
MANIFOLD NEPTUNE II (INSTRUMENTS) ×3 IMPLANT
MARKER SKIN DUAL TIP RULER LAB (MISCELLANEOUS) ×3 IMPLANT
NEEDLE SPNL 18GX3.5 QUINCKE PK (NEEDLE) ×6 IMPLANT
PACK LAMINECTOMY ORTHO (CUSTOM PROCEDURE TRAY) ×3 IMPLANT
PATTIES SURGICAL .75X.75 (GAUZE/BANDAGES/DRESSINGS) ×3 IMPLANT
PATTIES SURGICAL 1X1 (DISPOSABLE) IMPLANT
RUBBERBAND STERILE (MISCELLANEOUS) ×3 IMPLANT
SPONGE SURGIFOAM ABS GEL 100 (HEMOSTASIS) ×3 IMPLANT
STRIP CLOSURE SKIN 1/2X4 (GAUZE/BANDAGES/DRESSINGS) ×2 IMPLANT
SUT NURALON 4 0 TR CR/8 (SUTURE) IMPLANT
SUT PROLENE 3 0 PS 2 (SUTURE) ×3 IMPLANT
SUT VIC AB 1 CT1 27 (SUTURE)
SUT VIC AB 1 CT1 27XBRD ANTBC (SUTURE) IMPLANT
SUT VIC AB 1-0 CT2 27 (SUTURE) ×6 IMPLANT
SUT VIC AB 2-0 CT1 27 (SUTURE)
SUT VIC AB 2-0 CT1 TAPERPNT 27 (SUTURE) IMPLANT
SUT VIC AB 2-0 CT2 27 (SUTURE) ×3 IMPLANT
SYR 3ML LL SCALE MARK (SYRINGE) ×3 IMPLANT
TOWEL OR 17X26 10 PK STRL BLUE (TOWEL DISPOSABLE) ×3 IMPLANT
TOWEL OR NON WOVEN STRL DISP B (DISPOSABLE) ×3 IMPLANT
YANKAUER SUCT BULB TIP NO VENT (SUCTIONS) ×3 IMPLANT

## 2017-04-15 NOTE — Brief Op Note (Signed)
04/15/2017  12:52 PM  PATIENT:  Seth Decker  71 y.o. male  PRE-OPERATIVE DIAGNOSIS:  HNP Stenosis L5-S1, L4-5 Right  POST-OPERATIVE DIAGNOSIS:  HNP Stenosis L5-S1, L4-5 Right  PROCEDURE:  Procedure(s) with comments: Right microlumbar decompression L5-S1, possible L4-5 (Right) - 120 mins  SURGEON:  Surgeon(s) and Role:    Jene Every* Hillery Zachman, MD - Primary  PHYSICIAN ASSISTANT:   ASSISTANTS: Biss[place]   ANESTHESIA:   general  EBL:  10 mL   BLOOD ADMINISTERED:none  DRAINS: none   LOCAL MEDICATIONS USED:  MARCAINE     SPECIMEN:  No Specimen  DISPOSITION OF SPECIMEN:  N/A  COUNTS:  YES  TOURNIQUET:  * No tourniquets in log *  DICTATION: .Other Dictation: Dictation Number [id number]  PLAN OF CARE: Admit for overnight observation  PATIENT DISPOSITION:  PACU - hemodynamically stable.   Delay start of Pharmacological VTE agent (>24hrs) due to surgical blood loss or risk of bleeding: yes

## 2017-04-15 NOTE — Discharge Instructions (Signed)

## 2017-04-15 NOTE — Transfer of Care (Signed)
Immediate Anesthesia Transfer of Care Note  Patient: Seth Decker  Procedure(s) Performed: Right microlumbar decompression L5-S1 and L4-L5 (Right Back)  Patient Location: PACU  Anesthesia Type:General  Level of Consciousness: sedated  Airway & Oxygen Therapy: Patient Spontanous Breathing and Patient connected to face mask oxygen  Post-op Assessment: Report given to RN and Post -op Vital signs reviewed and stable  Post vital signs: Reviewed and stable  Last Vitals:  Vitals:   04/15/17 0806  BP: (!) 166/97  Pulse: 78  Resp: 18  Temp: 36.6 C  SpO2: 98%    Last Pain:  Vitals:   04/15/17 1028  TempSrc:   PainSc: 4       Patients Stated Pain Goal: 4 (04/15/17 1028)  Complications: No apparent anesthesia complications

## 2017-04-15 NOTE — Interval H&P Note (Signed)
History and Physical Interval Note:  04/15/2017 10:26 AM  Seth Decker  has presented today for surgery, with the diagnosis of HNP Stenosis L5-S1, L4-5 Right  The various methods of treatment have been discussed with the patient and family. After consideration of risks, benefits and other options for treatment, the patient has consented to  Procedure(s) with comments: Right microlumbar decompression L5-S1, possible L4-5 (Right) - 120 mins as a surgical intervention .  The patient's history has been reviewed, patient examined, no change in status, stable for surgery.  I have reviewed the patient's chart and labs.  Questions were answered to the patient's satisfaction.     [name]

## 2017-04-15 NOTE — Anesthesia Procedure Notes (Signed)
Procedure Name: Intubation Date/Time: 04/15/2017 11:11 AM Performed by: Lind Covert Pre-anesthesia Checklist: Patient identified, Emergency Drugs available, Suction available, Patient being monitored and Timeout performed Patient Re-evaluated:Patient Re-evaluated prior to induction Oxygen Delivery Method: Circle system utilized Preoxygenation: Pre-oxygenation with 100% oxygen Induction Type: IV induction Laryngoscope Size: Mac and 4 Grade View: Grade I Tube type: Oral Tube size: 7.5 mm Number of attempts: 1 Airway Equipment and Method: Stylet Placement Confirmation: ETT inserted through vocal cords under direct vision,  positive ETCO2 and breath sounds checked- equal and bilateral Secured at: 22 cm Tube secured with: Tape Dental Injury: Teeth and Oropharynx as per pre-operative assessment

## 2017-04-15 NOTE — Anesthesia Postprocedure Evaluation (Signed)
Anesthesia Post Note  Patient: Dominyk Blevens  Procedure(s) Performed: Right microlumbar decompression L5-S1 and L4-L5 (Right Back)     Patient location during evaluation: PACU Anesthesia Type: General Level of consciousness: awake and alert Pain management: pain level controlled Vital Signs Assessment: post-procedure vital signs reviewed and stable Respiratory status: spontaneous breathing, nonlabored ventilation, respiratory function stable and patient connected to nasal cannula oxygen Cardiovascular status: blood pressure returned to baseline and stable Postop Assessment: no apparent nausea or vomiting Anesthetic complications: no    Last Vitals:  Vitals:   04/15/17 1415 04/15/17 1430  BP: (!) 154/88 (!) 167/89  Pulse: 79 80  Resp: 12 13  Temp:  (!) 36.4 C  SpO2: 98% 99%    Last Pain:  Vitals:   04/15/17 1415  TempSrc:   PainSc: Ardean LarsenAsleep                 Adyson Vanburen E

## 2017-04-15 NOTE — Anesthesia Preprocedure Evaluation (Signed)
Anesthesia Evaluation  Patient identified by MRN, date of birth, ID band Patient awake    Reviewed: Allergy & Precautions, NPO status , Patient's Chart, lab work & pertinent test results  Airway Mallampati: II  TM Distance: >3 FB Neck ROM: Full    Dental  (+) Dental Advisory Given   Pulmonary former smoker,    breath sounds clear to auscultation       Cardiovascular negative cardio ROS   Rhythm:Regular Rate:Normal     Neuro/Psych negative neurological ROS     GI/Hepatic Neg liver ROS, GERD  ,  Endo/Other  diabetes, Type 2, Oral Hypoglycemic Agents  Renal/GU negative Renal ROS     Musculoskeletal  (+) Arthritis ,   Abdominal   Peds  Hematology negative hematology ROS (+)   Anesthesia Other Findings   Reproductive/Obstetrics                             Lab Results  Component Value Date   WBC 8.3 04/09/2017   HGB 12.5 (L) 04/09/2017   HCT 37.9 (L) 04/09/2017   MCV 90.7 04/09/2017   PLT 139 (L) 04/09/2017   Lab Results  Component Value Date   CREATININE 0.91 04/09/2017   BUN 10 04/09/2017   NA 137 04/09/2017   K 3.9 04/09/2017   CL 102 04/09/2017   CO2 27 04/09/2017    Anesthesia Physical Anesthesia Plan  ASA: II  Anesthesia Plan: General   Post-op Pain Management:    Induction: Intravenous  PONV Risk Score and Plan: 2 and Ondansetron, Dexamethasone and Treatment may vary due to age or medical condition  Airway Management Planned: Oral ETT  Additional Equipment:   Intra-op Plan:   Post-operative Plan: Extubation in OR  Informed Consent: I have reviewed the patients History and Physical, chart, labs and discussed the procedure including the risks, benefits and alternatives for the proposed anesthesia with the patient or authorized representative who has indicated his/her understanding and acceptance.   Dental advisory given  Plan Discussed with:  CRNA  Anesthesia Plan Comments:         Anesthesia Quick Evaluation

## 2017-04-15 NOTE — H&P (View-Only) (Signed)
Seth Decker is an 71 y.o. male.   Chief Complaint: Chief complaint is bilateral lower extremity radicular pain. HPI: The patient is a 71 year old male who presents to the practice today for a transition into care. The patient is transitioning into care from other and a summary of care was reviewed. The patient is here today in referral from Dr.Ramos. The patient reports low back symptoms including pain which began 1 year(s) (DOI: 12/30/15) ago following a specific injury. The injury occurred 1 year(s) ago at work due to twisting (Twisted while lifting load of water). Symptoms are reported to be located in the right low back and Symptoms include pain and numbness. The pain radiates to the right groin, right buttock, right thigh and right lower leg (into the foot). The patient describes the pain as sharp, dull, burning and aching. The patient describes the severity of their symptoms as varies in severity, about a 9-10 today. The patient feels as if the symptoms are worsening. Symptoms are exacerbated by standing (and walking). Symptoms are relieved by non-opioid analgesics (Tramadol and Flexeril). Current treatment includes non-opioid analgesics (Tramadol) and muscle relaxants (Flexeril). Prior to being seen today the patient was previously evaluated by a colleague (Dr.Ramos) week(s) ago. Symptoms present at the patient's previous evaluation included back pain. Past evaluation has included MRI of the lumbar spine. Past treatment has included nonsteroidal anti-inflammatory drugs (Voltaren Gel), non-opioid analgesics (Tramadol), muscle relaxants (Flexeril), epidural injections (10/21/16 - ESI L5-S1 and 12/23/16 - Right L5 SNRB (helps for a day or so)), physical therapy (no help) and Gabapentin. The patient states that this is a Worker's Compensation case. Note for "Back pain": Currently under sedentary work only  HISTORY OF PRESENT ILLNESS: This is a very pleasant gentleman, who presents through kind referral by Dr.  Richard Ramos for refractory lower extremity radicular pain.  On reviewing his extensive medical records to date, he was injured in 12/30/2015. He was lifting up on a hose on a port-a-john and twisted his back and ever since then, he has had radiating pain back into his right leg. He was seen at [facility]. MRI was obtained. Surgery was not recommended. It was recommended he see a physiatrist for further evaluation. He did physical therapy and activity modification until he was seen by Dr. Ramos where his pain was 10/10. Dr. Ramos placed him on gabapentin which made him dizzy. He has been placed on Ultram which he indicates does not address the pain sufficiently.  They discussed an epidural steroid injection, which he had at L5-S1. He had no relief at all from that. He did secure an attorney at [employer]. Dr. Ramos suggested L5 selective nerve root block due to neuroforaminal stenosis. The gentleman, at that point in time, did not have a sustained relief from it; however, for one hour he had numbness and relief of his pain indicating a positive diagnostic block. He had a slight reaction to Chlorascrub antiseptic. He returns for a surgical evaluation.  He reports his pain is predominantly buttock, right lower extremity radicular pain and secondarily leg pain.  He presents here with his sister.  The patient's pain drawing is organic.  No past medical history on file.  No past surgical history on file.  No family history on file. Social History:  has no tobacco, alcohol, and drug history on file.  Allergies: Allergies not on file   (Not in a hospital admission)  No results found for this or any previous visit (from the past   48 hour(s)). No results found.  Review of Systems  Constitutional: Negative.   HENT: Negative.   Eyes: Negative.   Respiratory: Negative.   Cardiovascular: Negative.   Gastrointestinal: Negative.   Genitourinary: Negative.   Musculoskeletal: Positive for back  pain.  Skin: Negative.   Neurological: Positive for sensory change and focal weakness.  Psychiatric/Behavioral: Negative.     There were no vitals taken for this visit. Physical Exam  Constitutional: He is oriented to person, place, and time. He appears well-developed.  HENT:  Head: Normocephalic.  Eyes: Pupils are equal, round, and reactive to light.  Neck: Normal range of motion.  Cardiovascular: Normal rate.   Respiratory: Effort normal.  GI: Soft.  Musculoskeletal:  On exam, moderate distress. Straight leg raises buttock, thigh and calf pain on the right, negative on the left. He has pain with extension. It is somewhat relieved with forward flexion. Limited flexion and extension. He has bilateral EHL weakness, right greater than the left.  Lumbar spine exam reveals no evidence of soft tissue swelling, deformity or skin ecchymosis. On palpation there is no tenderness of the lumbar spine. No flank pain with percussion. The abdomen is soft and nontender. Nontender over the trochanters. No cellulitis or lymphadenopathy.  Good range of motion of the lumbar spine without associated pain. Motor is 5/5 including tibialis anterior, plantar flexion, quadriceps and hamstrings. Patient is normoreflexic. There is no Babinski or clonus. Sensory exam is intact to light touch. Patient has good distal pulses. No DVT. Normal range of motion without instability of the hips, knees and ankles.  Neurological: He is alert and oriented to person, place, and time.    Three view radiographs, AP and lateral, flexion and extension demonstrates thoracolumbar scoliosis, dextrorotatory. He has a narrow interlaminar window at 4-5 and at 5-1. On the lateral, he has disc degeneration at 5-1, 4-5 and 3-4, mild at 2-1. Mild calcification of the aorta is noted.  MRI obtained 02/05/2016, he reports no changes in his symptoms since that MRI. That indicates end-stage disc degeneration at 5-1. He has multilevel disc  degeneration and disc protrusions at 3-4, 4-5 and at 5-1. There is a small, paracentral disc herniation at 4-5 to the right. There is neuroforaminal stenosis of L5 to the right as well.  There is associated lateral recess stenosis at L5-S1.  Assessment/Plan 1. 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 2. 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 3. Tobacco dependence.  Recommendations and discussion: Extensive discussion with Mr. Mccown concerning his current pathology, relevant anatomy and treatment options. Certainly, I feel that there are multiple options. I feel that he has had appropriate treatment to date with diagnostic studies, conservative treatment including therapy, injection therapy and pharmacologic management. Two options at this point; one to currently live with the symptoms and continue with pain management, injection therapy, etc. and the current restrictions that he has been forwarded.  From a surgical standpoint, on reviewing his studies to date he has an L5-S1 radiculopathy with EHL weakness and a diagnostic block at 5, given his pathology. He may have moderate-to-severe foraminal stenosis of L5 on the right and the lateral recess at L5-S1. He does have facet arthropathy and this indicating possibly abutting the L5 nerve root at 4-5 and at L5-S1. Given the profound radiculopathy, an option will be to consider a microlumbar decompression and foraminotomy and partial facetectomy at L5-S1   and a hemilaminectomy on the right in an effort to decompress the L5 and the S1 nerve roots. I did indicate that, that procedure would not address the back pain but with the complaint of radicular pain and the procedure that would address back pain, would require a multilevel fusion. I do not feel that a magnitude of that intervention would not  be appropriate in this instance.  Again though, he did have a diagnostic block at L5 that confirmed the pain generator; however, given that it has been over a year's duration, certainly a decompression even at this point may be unproductive. However, he is at the end of conservative treatment and feels he cannot live with his current symptomatology. It is reasonable to perform a decompression in the hopes to alleviate at least in some part, his radiculopathy. He, however, would have to commit to tobacco cessation in my opinion prior to that. I indicated deleterious side effects upon healing, epidural fibrosis, etc.  Again particularly down the L5-S1 if you can decompress the lateral recess and perform a foraminotomy of L5 and remove the superior articulating process of S1 that tends to project and impinge upon the 5 root when individuals are in the upright extended position, it would be an option. Again, I agreed with his initial conservative treatment as this would be considered a last resort. He has no symptoms on the left. We discussed tobacco cessation. In extensive detail, he has tried many modalities. He does get anxious when he is attempting to tobacco cessation. We; therefore, discussed Ativan preoperatively and perioperatively as an anxiolytic agent.  Recommend overnight immediate ambulation. We did discuss the risks and benefits.  I had an extensive discussion of the risks and benefits of the lumbar decompression with the patient including bleeding, infection, damage to neurovascular structures, epidural fibrosis, CSF leak requiring repair. We also discussed increase in pain, adjacent segment disease, recurrent disc herniation, need for future surgery including repeat decompression and/or fusion. We also discussed risks of postoperative hematoma, paralysis, anesthetic complications including DVT, PE, death, cardiopulmonary dysfunction. In addition, the perioperative and postoperative courses were  discussed in detail including the rehabilitative time and return to functional activity and work. I provided the patient with an illustrated handout and utilized the appropriate surgical models.  Particularly, I would not address back pain. They may have ongoing symptoms secondary to battered root syndrome etc. It does seem; however, significantly disabled by the predominant radiculopathy and therefore, given its duration and failing all other treatment and the absence of tolerance to gabapentin, other pharmacologic management I feel that it is appropriate to proceed with a limited decompression and foraminotomy on the right.  We spent a considerable time discussing all of these issues and over 40 minutes were dedicated to this discussion and his pathology, relevant anatomy and treatment options and treatment today. He is a diabetic. We discussed strict control of his glucose preoperatively and the time to maximum medical improvement will be three months postop.  He is fairly thin. He reports to be otherwise healthy. No chest pain, shortness of breath, strokes or heart attacks.  I appreciate the opportunity to evaluate Mr. Clinkscale. Discussed this at length and separately with his case manager. Again, I feel that if his pain was controlled pharmacologically, I would recommend nonoperative treatment. Again though, I do feel it is reasonable to proceed with a decompression.  microlumbar decompression L5-S1 right, possible L4-5  Cerina Leary M., PA-C for Dr. Beane 04/02/2017, 3:21 PM   

## 2017-04-16 DIAGNOSIS — M48061 Spinal stenosis, lumbar region without neurogenic claudication: Secondary | ICD-10-CM | POA: Diagnosis not present

## 2017-04-16 LAB — BASIC METABOLIC PANEL
ANION GAP: 10 (ref 5–15)
BUN: 21 mg/dL — ABNORMAL HIGH (ref 6–20)
CO2: 26 mmol/L (ref 22–32)
Calcium: 7.9 mg/dL — ABNORMAL LOW (ref 8.9–10.3)
Chloride: 99 mmol/L — ABNORMAL LOW (ref 101–111)
Creatinine, Ser: 1.11 mg/dL (ref 0.61–1.24)
Glucose, Bld: 220 mg/dL — ABNORMAL HIGH (ref 65–99)
Potassium: 4.3 mmol/L (ref 3.5–5.1)
Sodium: 135 mmol/L (ref 135–145)

## 2017-04-16 LAB — GLUCOSE, CAPILLARY: Glucose-Capillary: 203 mg/dL — ABNORMAL HIGH (ref 65–99)

## 2017-04-16 MED ORDER — HYDROMORPHONE HCL 1 MG/ML IJ SOLN
0.5000 mg | INTRAMUSCULAR | Status: DC | PRN
  Administered 2017-04-16: 01:00:00 0.5 mg via INTRAVENOUS
  Filled 2017-04-16: qty 1

## 2017-04-16 MED ORDER — HYDROCODONE-ACETAMINOPHEN 7.5-325 MG PO TABS
1.0000 | ORAL_TABLET | ORAL | Status: DC | PRN
  Administered 2017-04-16 (×3): 2 via ORAL
  Filled 2017-04-16 (×3): qty 2

## 2017-04-16 MED ORDER — HYDROMORPHONE HCL 1 MG/ML IJ SOLN: 0.5000 mg | INTRAMUSCULAR | Status: DC | PRN

## 2017-04-16 NOTE — Discharge Summary (Signed)
Physician Discharge Summary   Patient ID: Seth Decker MRN: [id number] DOB/AGE: 1945/12/17 71 y.o.  Admit date: 04/15/2017 Discharge date: 04/16/2017  Primary Diagnosis:   HNP Stenosis L5-S1, L4-5 Right  Admission Diagnoses:  Past Medical History:  Diagnosis Date  . Arthritis    painful and contracted digits bilateral   . Diabetes mellitus without complication ([place])   . GERD (gastroesophageal reflux disease)   . History of hiatal hernia    incisional hernia s/p pancreas surgery   . Low back pain radiating to right lower extremity    reports associated numbess on right side   . Pancreatic cyst 2008   remoced surgically   . Pneumonia    childhood    Discharge Diagnoses:   Active Problems:   Spinal stenosis at L4-L5 level  Procedure:  Procedure(s) (LRB): Right microlumbar decompression L5-S1 and L4-L5 (Right)   Consults: None  HPI:  see H&P    Laboratory Data: Hospital Outpatient Visit on 04/09/2017  Component Date Value Ref Range Status  . Glucose-Capillary 04/09/2017 146* 65 - 99 mg/dL Final  . Sodium 04/09/2017 137  135 - 145 mmol/L Final  . Potassium 04/09/2017 3.9  3.5 - 5.1 mmol/L Final  . Chloride 04/09/2017 102  101 - 111 mmol/L Final  . CO2 04/09/2017 27  22 - 32 mmol/L Final  . Glucose, Bld 04/09/2017 112* 65 - 99 mg/dL Final  . BUN 04/09/2017 10  6 - 20 mg/dL Final  . Creatinine, Ser 04/09/2017 0.91  0.61 - 1.24 mg/dL Final  . Calcium 04/09/2017 8.7* 8.9 - 10.3 mg/dL Final  . GFR calc non Af Amer 04/09/2017 >60  >60 mL/min Final  . GFR calc Af Amer 04/09/2017 >60  >60 mL/min Final   Comment: (NOTE) The eGFR has been calculated using the CKD EPI equation. This calculation has not been validated in all clinical situations. eGFR's persistently <60 mL/min signify possible Chronic Kidney Disease.   . Anion gap 04/09/2017 8  5 - 15 Final  . WBC 04/09/2017 8.3  4.0 - 10.5 K/uL Final  . RBC 04/09/2017 4.18* 4.22 - 5.81 MIL/uL Final  . Hemoglobin  04/09/2017 12.5* 13.0 - 17.0 g/dL Final  . HCT 04/09/2017 37.9* 39.0 - 52.0 % Final  . MCV 04/09/2017 90.7  78.0 - 100.0 fL Final  . MCH 04/09/2017 29.9  26.0 - 34.0 pg Final  . MCHC 04/09/2017 33.0  30.0 - 36.0 g/dL Final  . RDW 04/09/2017 13.4  11.5 - 15.5 % Final  . Platelets 04/09/2017 139* 150 - 400 K/uL Final  . MRSA, PCR 04/09/2017 NEGATIVE  NEGATIVE Final  . Staphylococcus aureus 04/09/2017 POSITIVE* NEGATIVE Final   Comment: (NOTE) The Xpert SA Assay (FDA approved for NASAL specimens in patients 63 years of age and older), is one component of a comprehensive surveillance program. It is not intended to diagnose infection nor to guide or monitor treatment.    No results for input(s): HGB in the last 72 hours. No results for input(s): WBC, RBC, HCT, PLT in the last 72 hours.  Recent Labs  04/16/17 0600  NA 135  K 4.3  CL 99*  CO2 26  BUN 21*  CREATININE 1.11  GLUCOSE 220*  CALCIUM 7.9*   No results for input(s): LABPT, INR in the last 72 hours.  X-Rays:Dg Lumbar Spine 2-3 Views  Result Date: 04/09/2017 CLINICAL DATA:  71 year old male with disc herniation. Initial encounter. EXAM: LUMBAR SPINE - 2-3 VIEW COMPARISON:  None. FINDINGS: Transitional S1 vertebral  body with rudimentary disc at the S1-2 level. Right aspect of L5 articulates with the upper sacrum. Mild L3-4 and L4-5 disc space narrowing. Moderate L5-S1 disc space narrowing with facet degenerative changes. Minimal curvature lumbar spine convex right. Vascular calcifications. IMPRESSION: Transitional vertebrae as detailed above. Correlation with any outside exam (MR) recommended prior to any surgical intervention to confirm that this is the level assignment utilized on MR imaging. Degenerative changes most notable L5-S1 level. Aortic Atherosclerosis (ICD10-I70.0). Electronically Signed   By: Genia Del M.D.   On: 04/09/2017 15:53   Dg Spine Portable 1 View  Result Date: 04/15/2017 CLINICAL DATA:  Back surgery  EXAM: PORTABLE SPINE - 1 VIEW COMPARISON:  04/15/2017 FINDINGS: Single lateral view lumbar spine in the operating room. Surgical instrument has been advanced over the spinal canal just below the L5 pedicle. Moderate disc degeneration L5-S1 with spurring IMPRESSION: Surgical localization L5-S1. Electronically Signed   By: Franchot Gallo M.D.   On: 04/15/2017 12:49   Dg Spine Portable 1 View  Result Date: 04/15/2017 CLINICAL DATA:  Intraoperative lumbar spine surgery. EXAM: PORTABLE SPINE - 1 VIEW COMPARISON:  Preoperative radiography 04/09/2017. Imaging from earlier today. FINDINGS: There is a transitional vertebra counted as S1 on preoperative numbering study. Retractor has been placed and overlaps the L5 spinous process and L5-S1 interspinous space. Upper probe overlaps the L5 posterior elements. The lower probe overlaps the S1 posterior elements. IMPRESSION: Intraoperative localization as described. A retractor overlaps the L5 spinous process and L5-S1 interspinous space. Electronically Signed   By: Monte Fantasia M.D.   On: 04/15/2017 11:50   Dg Spine Portable 1 View  Result Date: 04/15/2017 CLINICAL DATA:  Intraoperative lumbar surgery. EXAM: PORTABLE SPINE - 1 VIEW COMPARISON:  04/09/2017 FINDINGS: A single intraoperative cross-table lateral x-ray of the lumbar spine is provided. Transitional anatomy with rudimentary disc at S1-2. Two posterior metallic needles are present. The more superior metallic needle tip projects along the posterior margin of the L5 spinous process. The more inferior metallic needle tip projects along the posterior margin of the S1 spinous process. IMPRESSION: Intraoperative localization as described above. Electronically Signed   By: Kathreen Devoid   On: 04/15/2017 11:40    EKG:No orders found for this or any previous visit.   Hospital Course: Patient was admitted to [facility] and taken to the OR and underwent the above state procedure without complications.   Patient tolerated the procedure well and was later transferred to the recovery room and then to the orthopaedic floor for postoperative care.  They were given PO and IV analgesics for pain control following their surgery.  They were given 24 hours of postoperative antibiotics.   PT was consulted postop to assist with mobility and transfers.  The patient was allowed to be WBAT with therapy and was taught back precautions. Discharge planning was consulted to help with postop disposition and equipment needs.  Patient had a good night on the evening of surgery and started to get up OOB with therapy on day one. Patient was seen in rounds and was ready to go home on day one.  They were given discharge instructions and dressing directions.  They were instructed on when to follow up in the office with Dr. Tonita Cong.   Diet: Diabetic diet Activity:WBAT; Lspine precautions Follow-up:in 10-14 days Disposition - Home Discharged Condition: good   Discharge Instructions    Call MD / Call 911    Complete by:  As directed    If you  experience chest pain or shortness of breath, CALL 911 and be transported to the hospital emergency room.  If you develope a fever above 101 F, pus (white drainage) or increased drainage or redness at the wound, or calf pain, call your surgeon's office.   Call MD / Call 911    Complete by:  As directed    If you experience chest pain or shortness of breath, CALL 911 and be transported to the hospital emergency room.  If you develope a fever above 101 F, pus (white drainage) or increased drainage or redness at the wound, or calf pain, call your surgeon's office.   Constipation Prevention    Complete by:  As directed    Drink plenty of fluids.  Prune juice may be helpful.  You may use a stool softener, such as Colace (over the counter) 100 mg twice a day.  Use MiraLax (over the counter) for constipation as needed.   Constipation Prevention    Complete by:  As directed    Drink plenty of  fluids.  Prune juice may be helpful.  You may use a stool softener, such as Colace (over the counter) 100 mg twice a day.  Use MiraLax (over the counter) for constipation as needed.   Diet - low sodium heart healthy    Complete by:  As directed    Diet - low sodium heart healthy    Complete by:  As directed    Increase activity slowly as tolerated    Complete by:  As directed    Increase activity slowly as tolerated    Complete by:  As directed      Allergies as of 04/16/2017   No Known Allergies     Medication List    TAKE these medications   acetaminophen 500 MG tablet Commonly known as:  TYLENOL Take 1,000 mg by mouth every 6 (six) hours as needed for mild pain.   docusate sodium 100 MG capsule Commonly known as:  COLACE Take 1 capsule (100 mg total) by mouth 2 (two) times daily as needed for mild constipation.   HYDROcodone-acetaminophen 5-325 MG tablet Commonly known as:  NORCO/VICODIN Take 1-2 tablets by mouth every 4 (four) hours as needed for severe pain.   LORazepam 0.5 MG tablet Commonly known as:  ATIVAN Take 0.5 mg by mouth every 8 (eight) hours as needed for anxiety.   metFORMIN 1000 MG tablet Commonly known as:  GLUCOPHAGE Take 1,000 mg by mouth 2 (two) times daily.   polyethylene glycol packet Commonly known as:  MIRALAX / GLYCOLAX Take 17 g by mouth daily.   traMADol 50 MG tablet Commonly known as:  ULTRAM Take 50 mg by mouth daily as needed for moderate pain.   VISINE OP Place 1-2 drops into both eyes daily as needed (dry eyes).      Follow-up Information    Susa Day, MD Follow up in 2 week(s).   Specialty:  Orthopedic Surgery Contact information: [address] [place] [address] [phone]           Signed: Lacie Draft, PA-C Orthopaedic Surgery 04/16/2017, 7:48 AM

## 2017-04-16 NOTE — Op Note (Signed)
NAMJohnn Decker:  Cona, Wyn              ACCOUNT NO.:  [id number]  MEDICAL RECORD NO.:  [id number]  LOCATION:                                 FACILITY:  PHYSICIAN:  Jene EveryJeffrey Lochlyn Zullo, M.D.         DATE OF BIRTH:  DATE OF PROCEDURE:  04/15/2017 DATE OF DISCHARGE:                              OPERATIVE REPORT   PREOPERATIVE DIAGNOSIS:  Spinal stenosis, L4-5, L5-S1, right with severe foraminal stenosis.  POSTOPERATIVE DIAGNOSIS:  Spinal stenosis, L4-5, L5-S1, right with severe foraminal stenosis.  PROCEDURE PERFORMED: 1. Microlumbar decompression, L4-5 and L5-S1, right. 2. Hemilaminectomy of L5, right. 3. Foraminotomy of L5 right, S1 right.  ANESTHESIA:  General.  ASSISTANT:  Lanna PocheJacqueline Bissell, PA.  HISTORY:  This 71 year old gentleman with right lower extremity radicular pain, L5 nerve root distribution, found work-related injury, had a disk protrusion and persistent L5 radiculopathy with EHL weakness, temporary relief from selective nerve root block.  MRI indicating multifactorial stenosis, disk protrusion, foraminal stenosis and facet hypertrophy compressing the L5 nerve root.  We felt decompression of the L5 nerve root fully by hemilaminectomy, foraminotomy and decompression of the lateral recess at L4-5 and L5-S1 would help to alleviate his symptomatology.  Risks and benefits were discussed including bleeding, infection, damage to the neurovascular structures, no change in symptoms, worsening symptoms, DVT, PE, anesthetic complications, etc.  TECHNIQUE:  With the patient in supine position after induction of adequate general anesthesia, 2 g of Kefzol, placed prone on the [employer] frame.  All bony prominences were well padded.  Lumbar region was prepped and draped in usual sterile fashion.  Two 18-gauge spinal needles were utilized to localize the L5-S1 interspace, confirmed with x- ray and L4-5.  Incision was made from the spinous process of L4 to below S1.  Subcutaneous tissue  was dissected.  Electrocautery was utilized to achieve hemostasis.  Marcaine 0.25% with epinephrine was infiltrated in the paraspinous musculature.  Dorsolumbar fascia was divided in line with skin incision.  Paraspinous muscle elevated from lamina of L4-5 and L5-S1.  Operating microscope was draped and brought on the surgical field.  Noted was an increased lumbosacral angle and shingling of the L5 lamina as well as facet hypertrophy.  Utilized a micro osteotome to remove the inferior process of L5.  We then utilized a straight curette to detach the ligamentum flavum from the cephalad edge of S1. Hemilaminotomy of the caudad edge of L4 was performed.  A full hemilaminectomy of L5 was performed with neuro-patties protecting the neural elements.  There was a shingling of the L5 lamina and it was felt that this was compressing the L5 root.  Once this was removed, we identified the lateral aspect of the thecal sac and the flattened L5 nerve root.  We then decompressed lateral recess to the medial border of the pedicle of the opposite side of the operating room table, performed a foraminotomy of L5.  The ligamentum flavum buckle was removed as well on top of the nerve, this was protected with a [employer] retractor.  There was a venous plexus noted as well within the foramen.  We decompressed the lateral recess at L4-5 as well.  Tracking the L4-5  nerve root. There was no disk herniation noted at L4-5.  There was a disk osteophyte down to L5-1 laterally.  Copious irrigation with antibiotic irrigation, confirmatory radiograph obtained with a marker at the foramen of L5.  I did feel that I was able to utilize the [facility] and probed the foramen.  The dimensions of that were expanded as was the L5 nerve root. This extended beyond the L5 pedicle.  I felt this decompressed the L5 root.  Copiously irrigated with antibiotic irrigation.  No evidence of CSF leakage or active bleeding.  We removed  the [facility] retractor, irrigated the paraspinous musculature, closed the dorsolumbar fascia with 1 Vicryl, subcu with 2 and skin with staples.  Wound was dressed sterilely.  Placed supine on the hospital bed, extubated without difficulty and transported to the recovery room in satisfactory condition.  The patient tolerated the procedure well, no complications.  Assistant, Lanna Poche, [place].  Minimal blood loss.     Jene Every, M.D.   ______________________________ Jene Every, M.D.    Cordelia Pen  D:  04/15/2017  T:  04/15/2017  Job:  [id number]

## 2017-04-16 NOTE — Progress Notes (Addendum)
    Durable Medical Equipment        Start     Ordered   04/16/17 1033  For home use only DME Eelevated commode seat  Once     04/16/17 1032   04/16/17 1029  For home use only DME Walker rolling  Once    Question:  Patient needs a walker to treat with the following condition  Answer:  Spine degeneration   04/16/17 1031    Contacted adjuster, notified them that d/c was today. Faxed orders to her at [phone]. DME will be delivered to home.   Claim #: [id number] Date of injury: 01/09/2016 Adjuster and/or Nurse Case Manager name, phone and fax number: Gaynelle ArabianCLAIMS ADJUSTER- RENEE SCHISLER-[phone]  [phone]

## 2017-04-16 NOTE — Evaluation (Signed)
Physical Therapy Evaluation Patient Details Name: Seth Decker MRN: [id number] DOB: 12/31/1945 Today's Date: 04/16/2017   History of Present Illness  s/p L4-5 and L5-S1 decompression  Clinical Impression  Pt s/p back surgery and presents with functional mobility limitations 2* post op pain and back precautions.  Pt should progress to dc home with assist of sister.    Follow Up Recommendations No PT follow up    Equipment Recommendations  Rolling walker with 5" wheels    Recommendations for Other Services OT consult     Precautions / Restrictions Precautions Precautions: Back Precaution Booklet Issued: Yes (comment) Precaution Comments: All precautions reviewed x 2 with pt and sister Restrictions Weight Bearing Restrictions: No      Mobility  Bed Mobility Overal bed mobility: Needs Assistance Bed Mobility: Supine to Sit;Sit to Supine     Supine to sit: Min guard;Supervision Sit to supine: Min guard;Supervision   General bed mobility comments: cues for sequence, adherence to back precautions and log roll technique  Transfers Overall transfer level: Needs assistance Equipment used: Rolling walker (2 wheeled) Transfers: Sit to/from Stand Sit to Stand: Min guard;Supervision         General transfer comment: cues for safe transition position, use of UEs to self assist and adherence to back precautions  Ambulation/Gait Ambulation/Gait assistance: Min guard;Supervision Ambulation Distance (Feet): 200 Feet Assistive device: Rolling walker (2 wheeled) Gait Pattern/deviations: Step-through pattern;Decreased step length - right;Decreased step length - left;Shuffle;Trunk flexed Gait velocity: decr Gait velocity interpretation: Below normal speed for age/gender General Gait Details: cues for posture and position from RW  Stairs Stairs: Yes Stairs assistance: Min assist Stair Management: No rails;Step to pattern;Forwards Number of Stairs: 2 General stair comments:  use of door frame on one side and HHA on opposite to climb steps.  Wheelchair Mobility    Modified Rankin (Stroke Patients Only)       Balance                                             Pertinent Vitals/Pain Pain Assessment: 0-10 Pain Score: 4  Pain Location: back Pain Descriptors / Indicators: Aching Pain Intervention(s): Limited activity within patient's tolerance;Monitored during session;Premedicated before session    Home Living Family/patient expects to be discharged to:: Private residence Living Arrangements: Alone Available Help at Discharge: Family Type of Home: House Home Access: Stairs to enter Entrance Stairs-Rails: None Secretary/administrator of Steps: 2 Home Layout: One level Home Equipment: Cane - single point      Prior Function Level of Independence: Independent               Hand Dominance        Extremity/Trunk Assessment   Upper Extremity Assessment Upper Extremity Assessment: Overall WFL for tasks assessed    Lower Extremity Assessment Lower Extremity Assessment: Overall WFL for tasks assessed       Communication   Communication: No difficulties  Cognition Arousal/Alertness: Awake/alert Behavior During Therapy: WFL for tasks assessed/performed Overall Cognitive Status: Within Functional Limits for tasks assessed                                        General Comments      Exercises     Assessment/Plan    PT Assessment Patient needs  continued PT services  PT Problem List Decreased activity tolerance;Decreased mobility;Decreased knowledge of use of DME;Decreased knowledge of precautions;Pain       PT Treatment Interventions DME instruction;Gait training;Stair training;Functional mobility training;Therapeutic activities;Therapeutic exercise;Patient/family education    PT Goals (Current goals can be found in the Care Plan section)  Acute Rehab PT Goals Patient Stated Goal: return to  independence PT Goal Formulation: With patient Time For Goal Achievement: 04/19/17 Potential to Achieve Goals: Good    Frequency 7X/week   Barriers to discharge        Co-evaluation               AM-PAC PT "6 Clicks" Daily Activity  Outcome Measure Difficulty turning over in bed (including adjusting bedclothes, sheets and blankets)?: A Little Difficulty moving from lying on back to sitting on the side of the bed? : A Little Difficulty sitting down on and standing up from a chair with arms (e.g., wheelchair, bedside commode, etc,.)?: A Little Help needed moving to and from a bed to chair (including a wheelchair)?: A Little Help needed walking in hospital room?: A Little Help needed climbing 3-5 steps with a railing? : A Little 6 Click Score: 18    End of Session   Activity Tolerance: Patient tolerated treatment well Patient left: in chair;with call bell/phone within reach;with family/visitor present Nurse Communication: Mobility status PT Visit Diagnosis: Difficulty in walking, not elsewhere classified (R26.2)    Time: [id number] PT Time Calculation (min) (ACUTE ONLY): 37 min   Charges:   PT Evaluation $PT Eval Low Complexity: 1 Low PT Treatments $Gait Training: 8-22 mins   PT G Codes:        Pg [phone]   Naitik Hermann 04/16/2017, 12:03 PM

## 2017-04-16 NOTE — Evaluation (Signed)
Occupational Therapy Evaluation Patient Details Name: Seth Decker MRN: [id number] DOB: 06-Jul-1945 Today's Date: 04/16/2017    History of Present Illness s/p L4-5 and L5-S1 decompression   Clinical Impression   This 71 year old man was admitted for the above sx.  Pt's pain increased when he started to move.  Will follow in acute with supervision level goals.  Pt wants to be as independent as possible    Follow Up Recommendations  Supervision/Assistance - 24 hour    Equipment Recommendations  3 in 1 bedside commode (AE-see ADL comments)    Recommendations for Other Services       Precautions / Restrictions Precautions Precautions: Back Restrictions Weight Bearing Restrictions: No      Mobility Bed Mobility               General bed mobility comments: pt up in chair  Transfers Overall transfer level: Needs assistance Equipment used: Rolling walker (2 wheeled) Transfers: Sit to/from Stand Sit to Stand: Min guard;Min assist         General transfer comment: depending on surface height. cues for back precautions    Balance                                           ADL either performed or assessed with clinical judgement   ADL Overall ADL's : Needs assistance/impaired Eating/Feeding: Independent   Grooming: Oral care;Supervision/safety;Standing   Upper Body Bathing: Set up;Sitting   Lower Body Bathing: Sit to/from stand;Moderate assistance   Upper Body Dressing : Set up;Cueing for UE precautions   Lower Body Dressing: Maximal assistance;Sit to/from stand   Toilet Transfer: Minimal assistance;Ambulation;BSC;RW   Toileting- Clothing Manipulation and Hygiene: Moderate assistance;Sit to/from stand         General ADL Comments: pt has a high commode. Practiced this.  He would benefit from 3:1 over toilet to decrease back pain and increase independence.  Pain increased after sitting on commode.  Initially, pt didn't want 3:1 but he  is now agreeable. He would also benefit from AE:  reacher, long sponge, long shoehorn and toilet aide.  Sister will come over and don ted hose/socks.  Pt is very independent.  Reviewed back precautions and ADLs.  Demonstrated side stepping over tub     Vision         Perception     Praxis      Pertinent Vitals/Pain Pain Assessment: 0-10 Pain Score: 10-Worst pain ever Pain Location: back Pain Descriptors / Indicators: Aching Pain Intervention(s): Limited activity within patient's tolerance;Monitored during session;Premedicated before session;Repositioned     Hand Dominance     Extremity/Trunk Assessment Upper Extremity Assessment Upper Extremity Assessment: Overall WFL for tasks assessed           Communication Communication Communication: No difficulties   Cognition Arousal/Alertness: Awake/alert Behavior During Therapy: WFL for tasks assessed/performed Overall Cognitive Status: Within Functional Limits for tasks assessed                                     General Comments       Exercises     Shoulder Instructions      Home Living Family/patient expects to be discharged to:: Private residence Living Arrangements: Alone Available Help at Discharge: Family (sister lives nearby)  Bathroom Shower/Tub: Chief Strategy Officer: Handicapped height     Home Equipment: Cane - single point (2 canes)          Prior Functioning/Environment Level of Independence: Independent                 OT Problem List: Decreased activity tolerance;Decreased knowledge of use of DME or AE;Decreased knowledge of precautions;Pain      OT Treatment/Interventions: Self-care/ADL training;DME and/or AE instruction;Patient/family education    OT Goals(Current goals can be found in the care plan section) Acute Rehab OT Goals Patient Stated Goal: return to independence OT Goal Formulation: With patient/family Time For Goal  Achievement: 04/18/17 Potential to Achieve Goals: Good ADL Goals Pt Will Transfer to Toilet: with supervision;bedside commode;ambulating Additional ADL Goal #1: pt will complete adl with AE, with set up, supervision given  min cues for back precautions  OT Frequency: Min 2X/week   Barriers to D/C:            Co-evaluation              AM-PAC PT "6 Clicks" Daily Activity     Outcome Measure Help from another person eating meals?: None Help from another person taking care of personal grooming?: A Little Help from another person toileting, which includes using toliet, bedpan, or urinal?: A Little Help from another person bathing (including washing, rinsing, drying)?: A Lot Help from another person to put on and taking off regular upper body clothing?: A Little Help from another person to put on and taking off regular lower body clothing?: A Lot 6 Click Score: 17   End of Session    Activity Tolerance: Patient limited by pain Patient left: in chair;with call bell/phone within reach;with chair alarm set;with family/visitor present  OT Visit Diagnosis: History of falling (Z91.81)                Time: 1610-9604 OT Time Calculation (min): 29 min Charges:  OT General Charges $OT Visit: 1 Visit OT Evaluation $OT Eval Low Complexity: 1 Low OT Treatments $Self Care/Home Management : 8-22 mins G-Codes: OT G-codes **NOT FOR INPATIENT CLASS** Functional Assessment Tool Used: Clinical judgement Functional Limitation: Self care Self Care Current Status (V4098): At least 60 percent but less than 80 percent impaired, limited or restricted Self Care Goal Status (J1914): At least 20 percent but less than 40 percent impaired, limited or restricted   Seth Decker, Seth Decker [phone] 04/16/2017  Seth Decker 04/16/2017, 8:21 AM

## 2017-04-16 NOTE — Progress Notes (Signed)
Subjective:3 1 Day Post-Op Procedure(s) (LRB): Right microlumbar decompression L5-S1 and L4-L5 (Right) Patient reports pain as 3 on 0-10 scale.   Minimal leg pain. Objective: Vital signs in last 24 hours: Temp:  [97.4 F (36.3 C)-98.5 F (36.9 C)] 98.2 F (36.8 C) (10/18 0529) Pulse Rate:  [60-85] 61 (10/18 0529) Resp:  [12-18] 17 (10/18 0529) BP: (120-171)/(66-97) 130/66 (10/18 0529) SpO2:  [97 %-100 %] 99 % (10/18 0529) Weight:  [67.1 kg (148 lb)] 67.1 kg (148 lb) (10/17 0842)  Intake/Output from previous day: 10/17 0701 - 10/18 0700 In: 3875 [P.O.:720; I.V.:3000; IV Piggyback:155] Out: 2960 [Urine:2950; Blood:10] Intake/Output this shift: No intake/output data recorded.  No results for input(s): HGB in the last 72 hours. No results for input(s): WBC, RBC, HCT, PLT in the last 72 hours.  Recent Labs  04/16/17 0600  NA 135  K 4.3  CL 99*  CO2 26  BUN 21*  CREATININE 1.11  GLUCOSE 220*  CALCIUM 7.9*   No results for input(s): LABPT, INR in the last 72 hours.  Neurologically intact ABD soft Neurovascular intact Sensation intact distally Intact pulses distally Dorsiflexion/Plantar flexion intact Incision: dressing C/D/I  Assessment/Plan: 1 Day Post-Op Procedure(s) (LRB): Right microlumbar decompression L5-S1 and L4-L5 (Right) Advance diet Up with therapy D/C IV fluids Discharge home with home health  Discussed OR. Also compliance to Diet etc.  Eden Rho C 04/16/2017, 7:31 AM

## 2018-07-03 IMAGING — DX DG SPINE 1V PORT
1 series · 1 of 1 positions shown · non-contrast
Comparison: 04/09/2017

CLINICAL DATA: Intraoperative lumbar surgery.

EXAM:
PORTABLE SPINE - 1 VIEW

[l-spine lat]
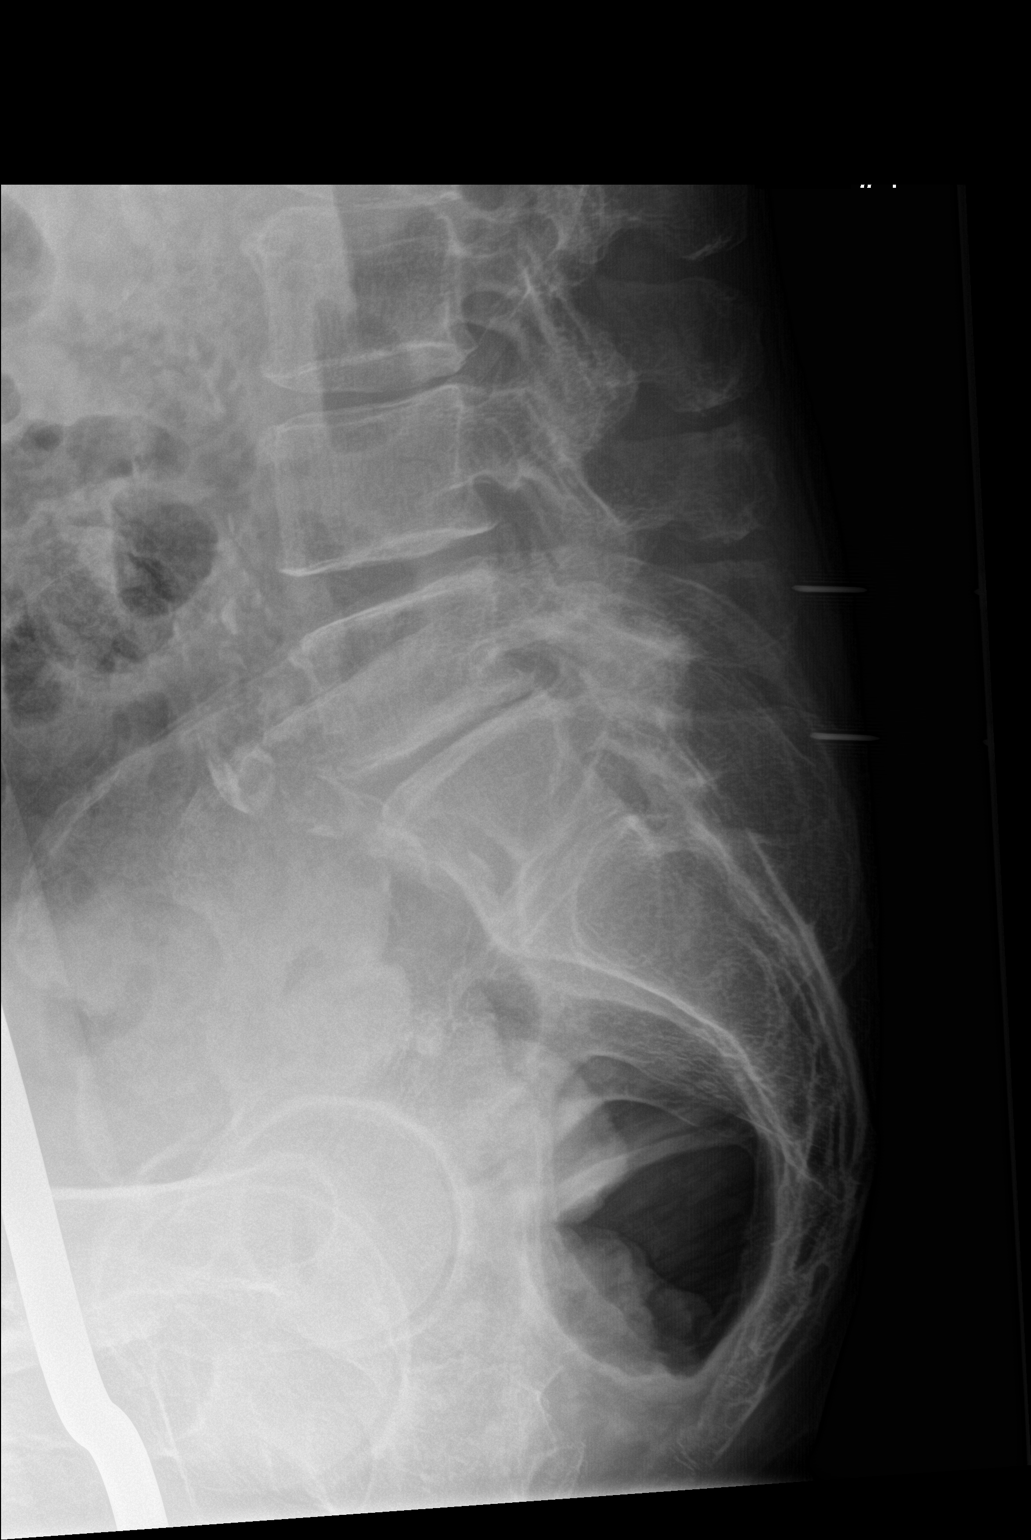

[1 of 1 positions shown; findings below may reference images not displayed]

FINDINGS: A single intraoperative cross-table lateral x-ray of the lumbar
spine is provided.

Transitional anatomy with rudimentary disc at S1-2.

Two posterior metallic needles are present. The more superior
metallic needle tip projects along the posterior margin of the L5
spinous process. The more inferior metallic needle tip projects
along the posterior margin of the S1 spinous process.
IMPRESSION: Intraoperative localization as described above.

## 2022-07-31 DEATH — deceased
